# Patient Record
Sex: Male | Born: 1965 | Race: Black or African American | Hispanic: No | Smoking: Never smoker
Health system: Southern US, Community
[De-identification: ages and names within clinical notes are randomized; demographics above are authoritative.]

---

## 2016-11-11 ENCOUNTER — Emergency Department (HOSPITAL_COMMUNITY)
Admission: EM | Admit: 2016-11-11 | Discharge: 2016-11-11 | Disposition: A | Discharge status: Home / Self Care | Payer: No Typology Code available for payment source | Attending: Emergency Medicine | Admitting: Emergency Medicine

## 2016-11-11 ENCOUNTER — Emergency Department (HOSPITAL_COMMUNITY): Payer: No Typology Code available for payment source

## 2016-11-11 ENCOUNTER — Encounter (HOSPITAL_COMMUNITY): Payer: Self-pay | Admitting: Emergency Medicine

## 2016-11-11 DIAGNOSIS — G44311 Acute post-traumatic headache, intractable: Secondary | ICD-10-CM

## 2016-11-11 DIAGNOSIS — Y998 Other external cause status: Secondary | ICD-10-CM | POA: Insufficient documentation

## 2016-11-11 DIAGNOSIS — Y929 Unspecified place or not applicable: Secondary | ICD-10-CM | POA: Insufficient documentation

## 2016-11-11 DIAGNOSIS — R51 Headache: Secondary | ICD-10-CM | POA: Diagnosis present

## 2016-11-11 DIAGNOSIS — Y9389 Activity, other specified: Secondary | ICD-10-CM | POA: Insufficient documentation

## 2016-11-11 DIAGNOSIS — R519 Headache, unspecified: Secondary | ICD-10-CM

## 2016-11-11 MED ORDER — OXYCODONE-ACETAMINOPHEN 5-325 MG PO TABS
1.0000 | ORAL_TABLET | Freq: Once | ORAL | Status: AC
  Administered 2016-11-11: 1 via ORAL
  Filled 2016-11-11: qty 1
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
## 2016-11-11 NOTE — ED Notes (Signed)
To CT ambulatory.
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
## 2020-07-31 ENCOUNTER — Other Ambulatory Visit: Payer: Self-pay
